# Patient Record
Sex: Female | Born: 2000 | Hispanic: Yes | Marital: Single | Smoking: Never smoker
Health system: Southern US, Community
[De-identification: ages and names within clinical notes are randomized; demographics above are authoritative.]

---
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
## 2023-10-26 ENCOUNTER — Encounter: Payer: Self-pay | Admitting: Emergency Medicine

## 2023-10-26 ENCOUNTER — Other Ambulatory Visit: Payer: Self-pay

## 2023-10-26 ENCOUNTER — Emergency Department
Admission: EM | Admit: 2023-10-26 | Discharge: 2023-10-26 | Disposition: A | Discharge status: Home / Self Care | Attending: Emergency Medicine | Admitting: Emergency Medicine

## 2023-10-26 DIAGNOSIS — R519 Headache, unspecified: Secondary | ICD-10-CM | POA: Diagnosis present

## 2023-10-26 DIAGNOSIS — G44209 Tension-type headache, unspecified, not intractable: Secondary | ICD-10-CM | POA: Diagnosis not present

## 2023-10-26 LAB — POC URINE PREG, ED: Preg Test, Ur: NEGATIVE

## 2023-10-26 MED ORDER — KETOROLAC TROMETHAMINE 15 MG/ML IJ SOLN
15.0000 mg | Freq: Once | INTRAMUSCULAR | Status: AC
  Administered 2023-10-26: 15 mg via INTRAVENOUS
  Filled 2023-10-26: qty 1

## 2023-10-26 MED ORDER — DIPHENHYDRAMINE HCL 50 MG/ML IJ SOLN
25.0000 mg | Freq: Once | INTRAMUSCULAR | Status: AC
  Administered 2023-10-26: 25 mg via INTRAVENOUS
  Filled 2023-10-26: qty 1

## 2023-10-26 MED ORDER — PROCHLORPERAZINE EDISYLATE 10 MG/2ML IJ SOLN
10.0000 mg | Freq: Once | INTRAMUSCULAR | Status: AC
  Administered 2023-10-26: 10 mg via INTRAVENOUS
  Filled 2023-10-26: qty 2

## 2023-10-26 MED ORDER — SODIUM CHLORIDE 0.9 % IV BOLUS
500.0000 mL | Freq: Once | INTRAVENOUS | Status: AC
  Administered 2023-10-26: 500 mL via INTRAVENOUS

## 2023-10-26 MED ORDER — MAGNESIUM SULFATE 2 GM/50ML IV SOLN
2.0000 g | Freq: Once | INTRAVENOUS | Status: AC
  Administered 2023-10-26: 2 g via INTRAVENOUS
  Filled 2023-10-26: qty 50

## 2023-10-26 MED ORDER — ACETAMINOPHEN 500 MG PO TABS
1000.0000 mg | ORAL_TABLET | Freq: Once | ORAL | Status: AC
  Administered 2023-10-26: 1000 mg via ORAL
  Filled 2023-10-26: qty 2

## 2023-10-26 NOTE — Discharge Instructions (Addendum)
 Take acetaminophen 650 mg and ibuprofen 400 mg every 6 hours for pain.  Take with food.  Thank you for choosing [place] for your health care today!  Please see your primary doctor this week for a follow up appointment.   If you have any new, worsening, or unexpected symptoms call your doctor right away or come back to the emergency department for reevaluation.  It was my pleasure to care for you today.   Daneil Dan Modesto Charon, MD

## 2023-10-26 NOTE — ED Triage Notes (Signed)
 Patient ambulatory to triage with steady gait, without difficulty or distress noted; pt reports awoke this morning generalized HA accomp by nausea; st hx migraines in past; tylenol taken PTA without relief

## 2023-10-26 NOTE — ED Provider Notes (Addendum)
 [facility] Provider Note    Event Date/Time   First MD Initiated Contact with Patient 10/26/23 [phone]     (approximate)   History   Headache   HPI  Lauren Herman is a 23 y.o. female   Past medical history of migraine headaches who presents to the emergency department with a frontal headache.  No trauma.  No recent illnesses, no fevers, no neck pain or stiffness.  No visual changes.  Some associated nausea.  She has had poor sleep recently.  She has been working hard working 2 jobs.  She has had a history of migraine headaches that affected her when she was younger.  The headache is frontal forehead.  She had a mild headache back in Thursday that got better with over-the-counter medications.  Then last night she had a recurrence.   External Medical Documents Reviewed: From an outpatient visit with internal medicine in August 2023 noted to have headaches, history of migraine headaches.      Physical Exam   Triage Vital Signs: ED Triage Vitals [10/26/23 0548]  Encounter Vitals Group     BP      Systolic BP Percentile      Diastolic BP Percentile      Pulse      Resp      Temp      Temp src      SpO2      Weight 150 lb (68 kg)     Height 5\' 7"  (1.702 m)     Head Circumference      Peak Flow      Pain Score 7     Pain Loc      Pain Education      Exclude from Growth Chart     Most recent vital signs: Vitals:   10/26/23 0552  BP: (!) 148/95  Pulse: 86  Resp: 18  Temp: 98.3 F (36.8 C)  SpO2: 99%    General: Awake, no distress.  CV:  Good peripheral perfusion.  Resp:  Normal effort.  Abd:  No distention. Other:  Awake alert comfortable appearing slightly hypertensive otherwise vital signs are normal.  Extraocular movements intact, pupils equal round and reactive, no signs of trauma to the head, neck supple full range of motion.  Sensation intact to the face arms and legs.  Motor strength intact gait normal.   ED Results /  Procedures / Treatments   Labs (all labs ordered are listed, but only abnormal results are displayed) Labs Reviewed  POC URINE PREG, ED     I ordered and reviewed the above labs they are notable for negative pregnancy test.  PROCEDURES:  Critical Care performed: No  Procedures   MEDICATIONS ORDERED IN ED: Medications  diphenhydrAMINE  (BENADRYL ) injection 25 mg (has no administration in time range)  ketorolac  (TORADOL ) 15 MG/ML injection 15 mg (has no administration in time range)  magnesium  sulfate IVPB 2 g 50 mL (has no administration in time range)  sodium chloride  0.9 % bolus 500 mL (has no administration in time range)  prochlorperazine  (COMPAZINE ) injection 10 mg (10 mg Intravenous Given 10/26/23 0618)  acetaminophen  (TYLENOL ) tablet 1,000 mg (1,000 mg Oral Given 10/26/23 0612)     IMPRESSION / MDM / ASSESSMENT AND PLAN / ED COURSE  I reviewed the triage vital signs and the nursing notes.  Patient's presentation is most consistent with acute presentation with potential threat to life or bodily function.  Differential diagnosis includes, but is not limited to, migraine headache, tension type headache, considered but less likely ICH, stroke, temporal arteritis, angitis   The patient is on the cardiac monitor to evaluate for evidence of arrhythmia and/or significant heart rate changes.  MDM:    Well-appearing patient with a history of migraine headaches with a headache today most likely tension type headache versus migraine headache.  Atraumatic, doubt ICH, SAH, stroke given no neurologic deficits, doubt temporal arteritis given no visual changes or temporal tenderness, and I doubt meningitis given her well appearance no fever or infectious symptoms and no meningismus.  I will give her a migraine cocktail for symptomatic treatment.  I anticipate discharge after this medication given my low clinical suspicion for life-threatening illness  at this time.  Feels much better after meds. DC       FINAL CLINICAL IMPRESSION(S) / ED DIAGNOSES   Final diagnoses:  Tension headache     Rx / DC Orders   ED Discharge Orders     None        Note:  This document was prepared using Dragon voice recognition software and may include unintentional dictation errors.    Buell Carmin, MD 10/26/23 9528    Buell Carmin, MD 10/26/23 [phone]
